# Patient Record
Sex: Female | Born: 1970 | Race: White | Hispanic: No | Marital: Married | Smoking: Current every day smoker
Health system: Southern US, Community
[De-identification: ages and names within clinical notes are randomized; demographics above are authoritative.]

## PROBLEM LIST (undated history)

## (undated) HISTORY — PX: NOSE SURGERY: SHX723

## (undated) HISTORY — PX: URETHRAL DILATION: SUR417

## (undated) HISTORY — PX: CLAVICLE SURGERY: SHX598

## (undated) HISTORY — PX: BARTHOLIN GLAND CYST EXCISION: SHX565

---

## 2006-06-19 ENCOUNTER — Emergency Department: Payer: Self-pay | Admitting: Emergency Medicine

## 2015-04-22 ENCOUNTER — Encounter: Payer: Self-pay | Admitting: Emergency Medicine

## 2015-04-22 ENCOUNTER — Emergency Department
Admission: EM | Admit: 2015-04-22 | Discharge: 2015-04-22 | Disposition: A | Discharge status: Home / Self Care | Payer: Self-pay

## 2015-04-22 ENCOUNTER — Emergency Department
Admission: EM | Admit: 2015-04-22 | Discharge: 2015-04-22 | Disposition: A | Discharge status: Home / Self Care | Payer: PRIVATE HEALTH INSURANCE | Attending: Emergency Medicine | Admitting: Emergency Medicine

## 2015-04-22 DIAGNOSIS — B958 Unspecified staphylococcus as the cause of diseases classified elsewhere: Secondary | ICD-10-CM | POA: Insufficient documentation

## 2015-04-22 DIAGNOSIS — L03312 Cellulitis of back [any part except buttock]: Secondary | ICD-10-CM | POA: Insufficient documentation

## 2015-04-22 DIAGNOSIS — Z720 Tobacco use: Secondary | ICD-10-CM | POA: Insufficient documentation

## 2015-04-22 DIAGNOSIS — L0390 Cellulitis, unspecified: Secondary | ICD-10-CM

## 2015-04-22 MED ORDER — SULFAMETHOXAZOLE-TRIMETHOPRIM 800-160 MG PO TABS: 2.0000 | ORAL_TABLET | Freq: Two times a day (BID) | ORAL | Status: DC

## 2015-04-22 NOTE — ED Notes (Signed)
Pt arrives to ER in custody. Pt alert and in NAD. Forensic blood draw completed per facility and police protocol. Police kit used per protocol. 

## 2015-04-22 NOTE — Discharge Instructions (Signed)
Abscess °An abscess is an infected area that contains a collection of pus and debris. It can occur in almost any part of the body. An abscess is also known as a furuncle or boil. °CAUSES  °An abscess occurs when tissue gets infected. This can occur from blockage of oil or sweat glands, infection of hair follicles, or a minor injury to the skin. As the body tries to fight the infection, pus collects in the area and creates pressure under the skin. This pressure causes pain. People with weakened immune systems have difficulty fighting infections and get certain abscesses more often.  °SYMPTOMS °Usually an abscess develops on the skin and becomes a painful mass that is red, warm, and tender. If the abscess forms under the skin, you may feel a moveable soft area under the skin. Some abscesses break open (rupture) on their own, but most will continue to get worse without care. The infection can spread deeper into the body and eventually into the bloodstream, causing you to feel ill.  °DIAGNOSIS  °Your caregiver will take your medical history and perform a physical exam. A sample of fluid may also be taken from the abscess to determine what is causing your infection. °TREATMENT  °Your caregiver may prescribe antibiotic medicines to fight the infection. However, taking antibiotics alone usually does not cure an abscess. Your caregiver may need to make a small cut (incision) in the abscess to drain the pus. In some cases, gauze is packed into the abscess to reduce pain and to continue draining the area. °HOME CARE INSTRUCTIONS  °· Only take over-the-counter or prescription medicines for pain, discomfort, or fever as directed by your caregiver. °· If you were prescribed antibiotics, take them as directed. Finish them even if you start to feel better. °· If gauze is used, follow your caregiver's directions for changing the gauze. °· To avoid spreading the infection: °· Keep your draining abscess covered with a  bandage. °· Wash your hands well. °· Do not share personal care items, towels, or whirlpools with others. °· Avoid skin contact with others. °· Keep your skin and clothes clean around the abscess. °· Keep all follow-up appointments as directed by your caregiver. °SEEK MEDICAL CARE IF:  °· You have increased pain, swelling, redness, fluid drainage, or bleeding. °· You have muscle aches, chills, or a general ill feeling. °· You have a fever. °MAKE SURE YOU:  °· Understand these instructions. °· Will watch your condition. °· Will get help right away if you are not doing well or get worse. °Document Released: 09/11/2005 Document Revised: 06/02/2012 Document Reviewed: 02/14/2012 °ExitCare® Patient Information ©2015 [employer]. This information is not intended to replace advice given to you by your health care provider. Make sure you discuss any questions you have with your health care provider. ° °Cellulitis °Cellulitis is an infection of the skin and the tissue under the skin. The infected area is usually red and tender. This happens most often in the arms and lower legs. °HOME CARE  °· Take your antibiotic medicine as told. Finish the medicine even if you start to feel better. °· Keep the infected arm or leg raised (elevated). °· Put a warm cloth on the area up to 4 times per day. °· Only take medicines as told by your doctor. °· Keep all doctor visits as told. °GET HELP IF: °· You see red streaks on the skin coming from the infected area. °· Your red area gets bigger or turns a dark color. °·   Your bone or joint under the infected area is painful after the skin heals.  Your infection comes back in the same area or different area.  You have a puffy (swollen) bump in the infected area.  You have new symptoms.  You have a fever. GET HELP RIGHT AWAY IF:   You feel very sleepy.  You throw up (vomit) or have watery poop (diarrhea).  You feel sick and have muscle aches and pains. MAKE SURE YOU:    Understand these instructions.  Will watch your condition.  Will get help right away if you are not doing well or get worse. Document Released: 05/20/2008 Document Revised: 04/18/2014 Document Reviewed: 02/17/2012 [facility] Patient Information 2015 [employer], [place]. This information is not intended to replace advice given to you by your health care provider. Make sure you discuss any questions you have with your health care provider.   Patient is medically-cleared for [place] [employer].  Apply warm compresses to promote healing.  Return to the ED as needed for wound check.  Take the prescription meds as directed.

## 2015-04-22 NOTE — ED Notes (Signed)
[place] [employer]. pt has several inch of redness and dark area noted to lumbar back, no drainge. pt reports she thinks it is a spider bite.

## 2015-04-23 MED ORDER — ORPHENADRINE CITRATE 30 MG/ML IJ SOLN
INTRAMUSCULAR | Status: AC
  Filled 2015-04-23: qty 2

## 2015-04-23 NOTE — ED Provider Notes (Signed)
[facility] Emergency Department Provider Note ?____________________________________________ ? Time seen: 2041 ? I have reviewed the triage vital signs and the nursing notes.  ________ HISTORY ? Chief Complaint Abscess  HPI  Carrie Leonard is a 44 y.o. female patient reports to the ED in the custody of police department primarily for medical clearance prior to incarceration. She has she has an area of redness, induration, warmth, tenderness to the lower back. She thinks she may have been bitten by a brown recluse spider several days earlier. She is also a known and or admitted IV drug user, with several areas on the bilateral antecubital areas, with induration and nodularity consistent with skin popping. She denies fever, chills, sweats or spontaneous drainage to this area. She is here for medical treatment and clearance.  History reviewed. No pertinent past medical history.  There are no active problems to display for this patient.  ? Past Surgical History  Procedure Laterality Date  . Urethral dilation     ?Allergies Review of patient's allergies indicates no known allergies. ? History reviewed. No pertinent family history. ? Social History History  Substance Use Topics  . Smoking status: Current Every Day Smoker  . Smokeless tobacco: Not on file  . Alcohol Use: Yes     Review of Systems Constitutional: Negative for fever. HEENT:  Normocephalic/atraumatic. Negative for visual/hearingchanges, sore throat, or nasal congestion. Cardiovascular: Negative for chest pain. Respiratory: Negative for shortness of breath. Musculoskeletal: Negative for back pain. Skin: cellulitis noted Neurological: Negative for headaches, focal weakness or numbness. Hematological/Lymphatic:Negative for enlarged lymph nodes  10-point ROS otherwise negative.  ____________________________________________   PHYSICAL EXAM:  VITAL SIGNS: ED Triage Vitals  Enc Vitals  Group     BP 04/22/15 2027 130/73 mmHg     Pulse Rate 04/22/15 2027 97     Resp 04/22/15 2027 22     Temp 04/22/15 2027 98.1 F (36.7 C)     Temp Source 04/22/15 2027 Oral     SpO2 04/22/15 2027 97 %     Weight 04/22/15 2027 135 lb (61.236 kg)     Height 04/22/15 2027 5\' 1"  (1.549 m)     Head Cir --      Peak Flow --      Pain Score 04/22/15 2027 7     Pain Loc --      Pain Edu? --      Excl. in [place]? --     Constitutional: Alert and oriented. Well appearing and in no distress. HEENT: Normocephalic and atraumatic.Conjunctivae are normal. PERRL. Normal extraocular movements. Mucous membranes are moist. Hematological/Lymphatic/Immunilogical: No cervical lymphadenopathy. Cardiovascular: Normal rate, regular rhythm.No murmurs, rubs, or gallops.  Respiratory: Normal respiratory effort. Breath sounds are clear and equal bilaterally. No wheezes/rales/rhonchi. Musculoskeletal: Nontender with normal range of motion in all extremities. No joint effusions.  No lower extremity tenderness nor edema. Neurologic:  Normal speech and language. No gross focal neurologic deficits are appreciated.  Skin:  Area to the lower back measuring about 6 cm in diameter with local erythema, induration, well-defined border, and central punctum. There is no active draining, fluctuance, or abscess appreciated. Psychiatric: Mood and affect are normal. Speech and behavior are normal. Patient exhibits appropriate insight and judgment.  _____________ PROCEDURES ? Procedure(s) performed: None Critical Care performed: None Current Outpatient Rx  Name  Route  Sig  Dispense  Refill  . sulfamethoxazole-trimethoprim (BACTRIM DS,SEPTRA DS) 800-160 MG per tablet   Oral   Take 2 tablets by mouth  2 (two) times daily.   40 tablet   0    _____________________________________________________ INITIAL IMPRESSION / ASSESSMENT AND PLAN / ED COURSE ? Cellulitis and chronic sterile abscess. Treatment and management instructions  to patient.  Medical care initiated and medical clearance given to [employer].   Pertinent labs & imaging results that were available during my care of the patient were reviewed by me and considered in my medical decision making (see chart for details).   ____________________________________________ FINAL CLINICAL IMPRESSION(S) / ED DIAGNOSES?  Final diagnoses:  Cellulitis due to Staphylococcus      Lissa HoardJenise V Bacon Maven Varelas, PA-C 04/23/15 0109  Governor Rooksebecca Lord, MD 04/26/15 1258

## 2016-02-04 DIAGNOSIS — L02416 Cutaneous abscess of left lower limb: Secondary | ICD-10-CM | POA: Insufficient documentation

## 2016-02-04 DIAGNOSIS — L02414 Cutaneous abscess of left upper limb: Secondary | ICD-10-CM | POA: Insufficient documentation

## 2016-02-04 DIAGNOSIS — B1920 Unspecified viral hepatitis C without hepatic coma: Secondary | ICD-10-CM | POA: Insufficient documentation

## 2016-02-04 DIAGNOSIS — F1910 Other psychoactive substance abuse, uncomplicated: Secondary | ICD-10-CM | POA: Insufficient documentation

## 2016-02-05 DIAGNOSIS — J4530 Mild persistent asthma, uncomplicated: Secondary | ICD-10-CM | POA: Insufficient documentation

## 2016-02-05 DIAGNOSIS — F17200 Nicotine dependence, unspecified, uncomplicated: Secondary | ICD-10-CM | POA: Insufficient documentation

## 2018-07-22 DIAGNOSIS — S42022A Displaced fracture of shaft of left clavicle, initial encounter for closed fracture: Secondary | ICD-10-CM | POA: Insufficient documentation

## 2020-01-04 ENCOUNTER — Encounter: Payer: Self-pay | Admitting: Emergency Medicine

## 2020-01-04 ENCOUNTER — Emergency Department
Admission: EM | Admit: 2020-01-04 | Discharge: 2020-01-05 | Disposition: A | Discharge status: Home / Self Care | Payer: Self-pay | Attending: Emergency Medicine | Admitting: Emergency Medicine

## 2020-01-04 ENCOUNTER — Other Ambulatory Visit: Payer: Self-pay

## 2020-01-04 DIAGNOSIS — K222 Esophageal obstruction: Secondary | ICD-10-CM | POA: Insufficient documentation

## 2020-01-04 DIAGNOSIS — X58XXXA Exposure to other specified factors, initial encounter: Secondary | ICD-10-CM | POA: Insufficient documentation

## 2020-01-04 DIAGNOSIS — Z20822 Contact with and (suspected) exposure to covid-19: Secondary | ICD-10-CM | POA: Insufficient documentation

## 2020-01-04 DIAGNOSIS — Z538 Procedure and treatment not carried out for other reasons: Secondary | ICD-10-CM | POA: Insufficient documentation

## 2020-01-04 DIAGNOSIS — F17210 Nicotine dependence, cigarettes, uncomplicated: Secondary | ICD-10-CM | POA: Insufficient documentation

## 2020-01-04 DIAGNOSIS — T18128A Food in esophagus causing other injury, initial encounter: Secondary | ICD-10-CM | POA: Insufficient documentation

## 2020-01-04 MED ORDER — GLUCAGON HCL RDNA (DIAGNOSTIC) 1 MG IJ SOLR
1.0000 mg | Freq: Once | INTRAMUSCULAR | Status: AC
  Administered 2020-01-05: 1 mg via INTRAVENOUS
  Filled 2020-01-04: qty 1

## 2020-01-04 MED ORDER — ONDANSETRON HCL 4 MG/2ML IJ SOLN
4.0000 mg | Freq: Once | INTRAMUSCULAR | Status: AC
  Administered 2020-01-05: 4 mg via INTRAVENOUS
  Filled 2020-01-04: qty 2

## 2020-01-04 NOTE — ED Triage Notes (Addendum)
Pt arrived via POV with reports of food bolus since about 12pm today.  Pt states she has a hx of food stuck before and can usually tell when it passes, pt states she tried to eat peanut butter to help it move, but states she is unable to keep any liquids down and vomits everytime she drinks something.   Pt also states that she noticed blood when she vomited.

## 2020-01-05 ENCOUNTER — Ambulatory Visit: Admit: 2020-01-05 | Payer: Self-pay | Admitting: Gastroenterology

## 2020-01-05 ENCOUNTER — Encounter: Payer: Self-pay | Admitting: Anesthesiology

## 2020-01-05 ENCOUNTER — Encounter
Admission: EM | Disposition: A | Discharge status: Home / Self Care | Payer: Self-pay | Source: Home / Self Care | Attending: Emergency Medicine

## 2020-01-05 ENCOUNTER — Emergency Department: Payer: Self-pay

## 2020-01-05 DIAGNOSIS — K222 Esophageal obstruction: Secondary | ICD-10-CM

## 2020-01-05 DIAGNOSIS — T18128A Food in esophagus causing other injury, initial encounter: Secondary | ICD-10-CM

## 2020-01-05 LAB — URINE DRUG SCREEN, QUALITATIVE (ARMC ONLY)
Amphetamines, Ur Screen: NOT DETECTED
Barbiturates, Ur Screen: NOT DETECTED
Benzodiazepine, Ur Scrn: POSITIVE — AB
Cannabinoid 50 Ng, Ur ~~LOC~~: NOT DETECTED
Cocaine Metabolite,Ur ~~LOC~~: POSITIVE — AB
MDMA (Ecstasy)Ur Screen: NOT DETECTED
Methadone Scn, Ur: NOT DETECTED
Opiate, Ur Screen: NOT DETECTED
Phencyclidine (PCP) Ur S: NOT DETECTED
Tricyclic, Ur Screen: NOT DETECTED

## 2020-01-05 LAB — COMPREHENSIVE METABOLIC PANEL
ALT: 53 U/L — ABNORMAL HIGH (ref 0–44)
AST: 47 U/L — ABNORMAL HIGH (ref 15–41)
Albumin: 4.8 g/dL (ref 3.5–5.0)
Alkaline Phosphatase: 90 U/L (ref 38–126)
Anion gap: 9 (ref 5–15)
BUN: 26 mg/dL — ABNORMAL HIGH (ref 6–20)
CO2: 28 mmol/L (ref 22–32)
Calcium: 9.8 mg/dL (ref 8.9–10.3)
Chloride: 104 mmol/L (ref 98–111)
Creatinine, Ser: 0.91 mg/dL (ref 0.44–1.00)
GFR calc Af Amer: >60 mL/min (ref >60)
GFR calc non Af Amer: >60 mL/min (ref >60)
Glucose, Bld: 104 mg/dL — ABNORMAL HIGH (ref 70–99)
Potassium: 3.7 mmol/L (ref 3.5–5.1)
Sodium: 141 mmol/L (ref 135–145)
Total Bilirubin: 1.1 mg/dL (ref 0.3–1.2)
Total Protein: 8.7 g/dL — ABNORMAL HIGH (ref 6.5–8.1)

## 2020-01-05 LAB — RESPIRATORY PANEL BY RT PCR (FLU A&B, COVID)
Influenza A by PCR: NEGATIVE
Influenza B by PCR: NEGATIVE
SARS Coronavirus 2 by RT PCR: NEGATIVE

## 2020-01-05 LAB — CBC
HCT: 44 % (ref 36.0–46.0)
Hemoglobin: 14.9 g/dL (ref 12.0–15.0)
MCH: 30.4 pg (ref 26.0–34.0)
MCHC: 33.9 g/dL (ref 30.0–36.0)
MCV: 89.8 fL (ref 80.0–100.0)
Platelets: 227 10*3/uL (ref 150–400)
RBC: 4.9 MIL/uL (ref 3.87–5.11)
RDW: 12.6 % (ref 11.5–15.5)
WBC: 13.6 10*3/uL — ABNORMAL HIGH (ref 4.0–10.5)
nRBC: 0 % (ref 0.0–0.2)

## 2020-01-05 SURGERY — ESOPHAGOGASTRODUODENOSCOPY (EGD) WITH PROPOFOL
Anesthesia: General

## 2020-01-05 MED ORDER — LORAZEPAM 2 MG/ML IJ SOLN
1.0000 mg | Freq: Once | INTRAMUSCULAR | Status: AC
Start: 2020-01-05 — End: 2020-01-05
  Administered 2020-01-05: 1 mg via INTRAVENOUS
  Filled 2020-01-05: qty 1

## 2020-01-05 MED ORDER — PROPOFOL 500 MG/50ML IV EMUL
INTRAVENOUS | Status: AC
  Filled 2020-01-05: qty 50

## 2020-01-05 MED ORDER — LORAZEPAM 2 MG/ML IJ SOLN
INTRAMUSCULAR | Status: AC
  Filled 2020-01-05: qty 1

## 2020-01-05 NOTE — ED Notes (Addendum)
Patient provided water to drink to assess if food bolus has moved. Patient was unable to keep fluid down and began to vomit the fluid again.

## 2020-01-05 NOTE — ED Notes (Signed)
Patient is drowsy after medication

## 2020-01-05 NOTE — ED Notes (Signed)
Pt oob with standby assist to BR without SOB.

## 2020-01-05 NOTE — ED Notes (Signed)
Pt denies SOB. Breath sounds clear. Spitting up clear secretions. NAD at this time.

## 2020-01-05 NOTE — ED Provider Notes (Signed)
[facility] Emergency Department Provider Note  ____________________________________________   First MD Initiated Contact with Patient 01/04/20 2341     (approximate)  I have reviewed the triage vital signs and the nursing notes.   HISTORY  Chief Complaint Food Bolus   HPI Carrie Leonard is a 49 y.o. female with history of IV cocaine use presents to the emergency department secondary to "roast beef stuck in my throat since noon today".  Patient states that she was eating a roast beef today when she felt like it became stuck in her throat patient then states that she ate some peanut butter in an attempt to get the roast beef to go down.  Patient states that this is happened multiple times in the past and that she is usually able to have it go down when she eats peanut butter       History reviewed. No pertinent past medical history.  There are no problems to display for this patient.   Past Surgical History:  Procedure Laterality Date  . URETHRAL DILATION      Prior to Admission medications   Medication Sig Start Date End Date Taking? Authorizing Provider  sulfamethoxazole-trimethoprim (BACTRIM DS,SEPTRA DS) 800-160 MG per tablet Take 2 tablets by mouth 2 (two) times daily. 04/22/15   Menshew, Dannielle Karvonen, PA-C    Allergies Patient has no known allergies.  No family history on file.  Social History Social History   Tobacco Use  . Smoking status: Current Every Day Smoker    Packs/day: 1.00    Types: Cigarettes  . Smokeless tobacco: Never Used  Substance Use Topics  . Alcohol use: Yes  . Drug use: Not on file    Review of Systems Constitutional: No fever/chills Eyes: No visual changes. ENT: No sore throat. Cardiovascular: Denies chest pain. Respiratory: Denies shortness of breath. Gastrointestinal: No abdominal pain.  No nausea, no vomiting.  No diarrhea.  No constipation. Genitourinary: Negative for dysuria. Musculoskeletal:  Negative for neck pain.  Negative for back pain. Integumentary: Negative for rash. Neurological: Negative for headaches, focal weakness or numbness.  ____________________________________________   PHYSICAL EXAM:  VITAL SIGNS: ED Triage Vitals  Enc Vitals Group     BP 01/04/20 2316 127/69     Pulse Rate 01/04/20 2316 94     Resp 01/04/20 2316 20     Temp 01/04/20 2316 98.5 F (36.9 C)     Temp Source 01/04/20 2316 Oral     SpO2 01/04/20 2316 95 %     Weight 01/04/20 2313 57.2 kg (126 lb)     Height 01/04/20 2313 1.549 m (5\' 1" )     Head Circumference --      Peak Flow --      Pain Score 01/04/20 2313 7     Pain Loc --      Pain Edu? --      Excl. in [place]? --     Constitutional: Alert and oriented.  Actively vomiting oral secretions Eyes: Conjunctivae are normal.  Mouth/Throat: Patient is wearing a mask. Neck: No stridor.  No meningeal signs.   Cardiovascular: Normal rate, regular rhythm. Good peripheral circulation. Grossly normal heart sounds. Respiratory: Normal respiratory effort.  No retractions. Gastrointestinal: Soft and nontender. No distention.  Musculoskeletal: No lower extremity tenderness nor edema. No gross deformities of extremities. Neurologic:  Normal speech and language. No gross focal neurologic deficits are appreciated.  Skin:  Skin is warm, dry and intact. Psychiatric: Mood and affect  are normal. Speech and behavior are normal.  ____________________________________________   LABS (all labs ordered are listed, but only abnormal results are displayed)  Labs Reviewed  CBC - Abnormal; Notable for the following components:      Result Value   WBC 13.6 (*)    All other components within normal limits  COMPREHENSIVE METABOLIC PANEL - Abnormal; Notable for the following components:   Glucose, Bld 104 (*)    BUN 26 (*)    Total Protein 8.7 (*)    AST 47 (*)    ALT 53 (*)    All other components within normal limits   _______________  RADIOLOGY I,  [place] Maretta Overdorf, personally viewed and evaluated these images (plain radiographs) as part of my medical decision making, as well as reviewing the written report by the radiologist.  ED MD interpretation: No active cardiopulmonary disease noted on chest x-ray.  Official radiology report(s): DG Chest 2 View  Result Date: 01/05/2020 CLINICAL DATA:  Dysphagia EXAM: CHEST - 2 VIEW COMPARISON:  None. FINDINGS: The heart size is normal. There is no evidence for pneumomediastinum. The patient is status post prior plate screw fixation of the left clavicle. There is no pneumothorax. No focal infiltrate. No significant pleural effusion. IMPRESSION: No active cardiopulmonary disease. Electronically Signed   By: Katherine Mantle M.D.   On: 01/05/2020 01:31    ____________________________________________   Procedures   ____________________________________________   INITIAL IMPRESSION / MDM / ASSESSMENT AND PLAN / ED COURSE  As part of my medical decision making, I reviewed the following data within the electronic MEDICAL RECORD NUMBER   49 year old female presented with above-stated history and physical exam secondary to esophageal food impaction.  Attempted carbonated beverage however patient subsequently had emesis.  Patient given IV glucagon 1 mg with out any improvement of symptoms..  Patient still unable to tolerate p.o. liquids and as such patient discussed with Dr. Tobi Bastos gastroenterologist on-call who plans to perform endoscopy later this morning.  Patient is currently resting comfortably and handling oral secretions.  ____________________________________________  FINAL CLINICAL IMPRESSION(S) / ED DIAGNOSES  Final diagnoses:  Esophageal obstruction due to food impaction     MEDICATIONS GIVEN DURING THIS VISIT:  Medications  glucagon (human recombinant) (GLUCAGEN) injection 1 mg (1 mg Intravenous Given 01/05/20 0026)  ondansetron (ZOFRAN) injection 4 mg (4 mg Intravenous Given 01/05/20 0027)      ED Discharge Orders    None      *Please note:  Carrie Leonard was evaluated in Emergency Department on 01/05/2020 for the symptoms described in the history of present illness. She was evaluated in the context of the global COVID-19 pandemic, which necessitated consideration that the patient might be at risk for infection with the SARS-CoV-2 virus that causes COVID-19. Institutional protocols and algorithms that pertain to the evaluation of patients at risk for COVID-19 are in a state of rapid change based on information released by regulatory bodies including the CDC and federal and state organizations. These policies and algorithms were followed during the patient's care in the ED.  Some ED evaluations and interventions may be delayed as a result of limited staffing during the pandemic.*  Note:  This document was prepared using Dragon voice recognition software and may include unintentional dictation errors.   Darci Current, MD 01/05/20 [phone]

## 2020-01-05 NOTE — ED Notes (Signed)
Pt provided with sip of soda. Pt able to tolerate first sip but unable to tolerate second. Pt producing foam emesis in bag. MD made aware.

## 2020-01-05 NOTE — Consult Note (Signed)
Carrie Leonard , MD [address], [place], [place], [place], [address] [address] [place], [place], [place], [address] Phone: [phone]  Fax: [phone]  Consultation  Referring Provider:     Dr Owens Shark  Primary Care Physician:  Carrie Leonard Carrie Jarvis, MD Primary Gastroenterologist: None         Reason for Consultation:     Food impaction  Date of Admission:  01/04/2020 Date of Consultation:  01/05/2020         HPI:   Carrie Leonard is a 49 y.o. female presented to the emergency room early this morning with a food impaction.  Was eating roast beef at around noon and got stuck in her esophagus.  Subsequently she tried consuming some peanut butter to push the food down.  Did not go through and came into the ER.  In the ER was given soda and glucagon which did not help.  History of cocaine use.  I was informed around 4 AM and the ER that we will perform an EGD this morning.  When came in to th endoscopy unit for an EGD , she felt the impaction had cleared. Drank 2 cups of water and it went down .    History reviewed. No pertinent past medical history.  Past Surgical History:  Procedure Laterality Date  . URETHRAL DILATION      Prior to Admission medications   Medication Sig Start Date End Date Taking? Authorizing Provider  sulfamethoxazole-trimethoprim (BACTRIM DS,SEPTRA DS) 800-160 MG per tablet Take 2 tablets by mouth 2 (two) times daily. 04/22/15   Menshew, Dannielle Karvonen, PA-C    No family history on file.   Social History   Tobacco Use  . Smoking status: Current Every Day Smoker    Packs/day: 1.00    Types: Cigarettes  . Smokeless tobacco: Never Used  Substance Use Topics  . Alcohol use: Yes  . Drug use: Not on file    Allergies as of 01/04/2020  . (No Known Allergies)    Review of Systems:    All systems reviewed and negative except where noted in HPI.   Physical Exam:  Vital signs in last 24 hours: Temp:  [98.5 F (36.9 C)] 98.5 F (36.9 C) (01/19  2316) Pulse Rate:  [92-110] 110 (01/20 0700) Resp:  [20] 20 (01/20 0249) BP: (126-150)/(69-96) 150/77 (01/20 0700) SpO2:  [94 %-96 %] 94 % (01/20 0700) Weight:  [57.2 kg] 57.2 kg (01/19 2313)   General:   Pleasant, cooperative in NAD Head:  Normocephalic and atraumatic. Eyes:   No icterus.   Conjunctiva pink. PERRLA. Ears:  Normal auditory acuity. Psych:  Alert and cooperative. Normal affect.  LAB RESULTS: Recent Labs    01/05/20 0032  WBC 13.6*  HGB 14.9  HCT 44.0  PLT 227   BMET Recent Labs    01/05/20 0032  NA 141  K 3.7  CL 104  CO2 28  GLUCOSE 104*  BUN 26*  CREATININE 0.91  CALCIUM 9.8   LFT Recent Labs    01/05/20 0032  PROT 8.7*  ALBUMIN 4.8  AST 47*  ALT 53*  ALKPHOS 90  BILITOT 1.1   PT/INR No results for input(s): LABPROT, INR in the last 72 hours.  STUDIES: DG Chest 2 View  Result Date: 01/05/2020 CLINICAL DATA:  Dysphagia EXAM: CHEST - 2 VIEW COMPARISON:  None. FINDINGS: The heart size is normal. There is no evidence for pneumomediastinum. The patient is status post prior plate screw  fixation of the left clavicle. There is no pneumothorax. No focal infiltrate. No significant pleural effusion. IMPRESSION: No active cardiopulmonary disease. Electronically Signed   By: Katherine Mantle M.D.   On: 01/05/2020 01:31      Impression / Plan:   Carrie Leonard is a 49 y.o. y/o female with a food impaction at noon yesterday.  History of cocaine use. The obstruction relieved on its own when she came up to endoscopy   Plan 1.  Home and outpatient GI follow up   Thank you for involving me in the care of this patient.      LOS: 0 days   Wyline Mood, MD  01/05/2020, 9:20 AM

## 2020-01-05 NOTE — Progress Notes (Signed)
Procedure was cancelled. Food bolus was cleared after drinking water. Dr. Tobi Bastos was present at the bedside. Patient to be discharged home, her mother will be transporting her home.

## 2020-01-05 NOTE — ED Notes (Signed)
Patient unable to hold fluid down. Patient was provided cola after receiving glucagon and states she vomited the fluid back up again

## 2020-01-05 NOTE — ED Notes (Signed)
Woke pt up. Able to talk, no sob noted. Offered to talk to edp to see if pt could have a fluid challenge. Pt stated that it was still there and wanted to have procedure.

## 2020-01-05 NOTE — ED Notes (Signed)
Patient states at 12pm she ate roast and it has felt stuck in her throat since. Patient states she is unable to swallow fluids. Patient states she vomits anything swallowed. MD provided patient with soda and patient was unable to tolerate and vomited after.

## 2022-01-02 ENCOUNTER — Other Ambulatory Visit: Payer: Self-pay

## 2022-01-02 DIAGNOSIS — Z1231 Encounter for screening mammogram for malignant neoplasm of breast: Secondary | ICD-10-CM

## 2022-02-04 ENCOUNTER — Other Ambulatory Visit: Payer: Self-pay

## 2022-02-04 DIAGNOSIS — Z1211 Encounter for screening for malignant neoplasm of colon: Secondary | ICD-10-CM

## 2022-02-06 ENCOUNTER — Ambulatory Visit: Payer: Self-pay | Attending: Oncology

## 2022-04-22 ENCOUNTER — Other Ambulatory Visit: Payer: Self-pay

## 2022-04-22 DIAGNOSIS — Z1211 Encounter for screening for malignant neoplasm of colon: Secondary | ICD-10-CM

## 2022-04-23 ENCOUNTER — Ambulatory Visit: Payer: Self-pay | Attending: Hematology and Oncology | Admitting: *Deleted

## 2022-04-23 ENCOUNTER — Other Ambulatory Visit: Payer: Self-pay

## 2022-04-23 ENCOUNTER — Encounter: Payer: Self-pay | Admitting: *Deleted

## 2022-04-23 ENCOUNTER — Ambulatory Visit
Admission: RE | Admit: 2022-04-23 | Discharge: 2022-04-23 | Disposition: A | Discharge status: Home / Self Care | Payer: Self-pay | Source: Ambulatory Visit | Attending: Obstetrics and Gynecology | Admitting: Obstetrics and Gynecology

## 2022-04-23 ENCOUNTER — Encounter (INDEPENDENT_AMBULATORY_CARE_PROVIDER_SITE_OTHER): Payer: Self-pay

## 2022-04-23 VITALS — BP 149/69 | HR 71 | Resp 18 | Wt 128.0 lb

## 2022-04-23 DIAGNOSIS — Z01419 Encounter for gynecological examination (general) (routine) without abnormal findings: Secondary | ICD-10-CM

## 2022-04-23 DIAGNOSIS — Z1231 Encounter for screening mammogram for malignant neoplasm of breast: Secondary | ICD-10-CM | POA: Insufficient documentation

## 2022-04-23 NOTE — Progress Notes (Addendum)
Carrie Leonard is a 51 y.o. female who presents to [facility] clinic today with no complaints She is here for her annual screening well woman visit.  ?  ?Pap Smear: Pap not smear completed today. Last Pap smear was 01/11/20 at [facility] clinic and was normal. Per patient has no history of an abnormal Pap smear. Last Pap smear result is available in Epic. Last pap was negative without HPV co-testing. ?  ?Physical exam: ?Breasts ?Breasts symmetrical. Bilateral breasts have significant bruising, especially on the left. Old and new bruises noted with yellow and purple and red hues.  Asked patient if she had fallen and she stated that "I've been shooting up".  No nipple retraction bilateral breasts. No nipple discharge bilateral breasts. No lymphadenopathy. No lumps palpated bilateral breasts. I did note track mark on bilateral hands and arms. ?  ?Pelvic/Bimanual ?Pap is not indicated today  ?  ?Smoking History: ?Patient has is a current smoker at 1 packs per day.  Patient referred to quit line and [place] Smoking Cessation classes.  ?  ?Patient Navigation: ?Patient education provided. Access to services provided for patient through [facility] program. No interpreter provided. No transportation provided  ? ?Colorectal Cancer Screening: ?Per patient has never had colonoscopy completed.  No complaints today. Fit test given to patient.  Instructed to return via mail. ?  ?Breast and Cervical Cancer Risk Assessment: ?Patient does not have family history of breast cancer, known genetic mutations, or radiation treatment to the chest before age 29. Patient has history of cervical dysplasia, immunocompromised, or DES exposure in-utero.  Patient's sister deceased in January 02, 2023from Uterine cancer per patient. ? ?Risk Assessment   ?No risk assessment data ?  ? ?Risk Assessment   ? ? Risk Scores   ? ?   04/23/2022  ? Last edited by: Lesle Chris, RN  ? 5-year risk: 1.1 %  ? Lifetime risk: 8.8 %  ? ?  ?  ? ?  ?   ? ?A: ?BCCCP exam without pap smear ? ?P: ?Referred patient to the [facility] for a screening mammogram. Appointment scheduled for today. ? ?Jim Like, RN ?04/23/2022 1:47 PM   ?

## 2023-04-08 ENCOUNTER — Other Ambulatory Visit: Payer: Self-pay

## 2023-04-08 DIAGNOSIS — Z1231 Encounter for screening mammogram for malignant neoplasm of breast: Secondary | ICD-10-CM

## 2023-04-28 ENCOUNTER — Ambulatory Visit
Admission: RE | Admit: 2023-04-28 | Discharge: 2023-04-28 | Disposition: A | Discharge status: Home / Self Care | Payer: Self-pay | Source: Ambulatory Visit | Attending: Obstetrics and Gynecology | Admitting: Obstetrics and Gynecology

## 2023-04-28 ENCOUNTER — Ambulatory Visit: Payer: Self-pay | Attending: Hematology and Oncology | Admitting: Hematology and Oncology

## 2023-04-28 ENCOUNTER — Ambulatory Visit: Payer: Self-pay

## 2023-04-28 VITALS — BP 130/68 | Wt 130.0 lb

## 2023-04-28 DIAGNOSIS — Z1231 Encounter for screening mammogram for malignant neoplasm of breast: Secondary | ICD-10-CM | POA: Insufficient documentation

## 2023-04-28 DIAGNOSIS — Z1211 Encounter for screening for malignant neoplasm of colon: Secondary | ICD-10-CM

## 2023-04-28 DIAGNOSIS — Z01419 Encounter for gynecological examination (general) (routine) without abnormal findings: Secondary | ICD-10-CM

## 2023-04-28 MED ORDER — CEPHALEXIN 500 MG PO CAPS: 500.0000 mg | ORAL_CAPSULE | Freq: Four times a day (QID) | ORAL | 0 refills | Status: AC

## 2023-04-28 NOTE — Progress Notes (Signed)
Ms. Carrie Leonard is a 52 y.o. No obstetric history on file. female who presents to [facility] clinic today with no complaints.    Pap Smear: Pap smear completed today. Last Pap smear was 01/11/2020 at [facility] clinic and was normal with HPV not completed.  Per patient has history of an abnormal Pap smear with cryotherapy in her teens. Last Pap smear result is available in Epic.   Physical exam: Breasts Breasts symmetrical. No skin abnormalities bilateral breasts. No nipple retraction bilateral breasts. No nipple discharge bilateral breasts. No lymphadenopathy. No lumps palpated bilateral breasts. MS DIGITAL SCREENING TOMO BILATERAL  Result Date: 04/24/2022 CLINICAL DATA:  Screening. EXAM: DIGITAL SCREENING BILATERAL MAMMOGRAM WITH TOMOSYNTHESIS AND CAD TECHNIQUE: Bilateral screening digital craniocaudal and mediolateral oblique mammograms were obtained. Bilateral screening digital breast tomosynthesis was performed. The images were evaluated with computer-aided detection. COMPARISON:  This is the patient's baseline mammogram. ACR Breast Density Category b: There are scattered areas of fibroglandular density. FINDINGS: There are no findings suspicious for malignancy. IMPRESSION: No mammographic evidence of malignancy. A result letter of this screening mammogram will be mailed directly to the patient. RECOMMENDATION: Screening mammogram in one year. (Code:[id number]-01Y) BI-RADS CATEGORY  1: Negative. Electronically Signed   By: Romona Curls M.D.   On: 04/24/2022 09:30          Pelvic/Bimanual Ext Genitalia No lesions, no swelling and no discharge observed on external genitalia.        Vagina Vagina pink and normal texture. No lesions or discharge observed in vagina.        Cervix Cervix is present. Cervix pink and of normal texture. No discharge observed.    Uterus Uterus is present and palpable. Uterus in normal position and normal size.        Adnexae Bilateral ovaries present  and palpable. No tenderness on palpation.         Rectovaginal No rectal exam completed today since patient had no rectal complaints. No skin abnormalities observed on exam.     Smoking History: Patient has is a current smoker at 1 packs per day and was referred to quit line. We will refer for lung cancer screening.    Patient Navigation: Patient education provided. Access to services provided for patient through [facility] program. No interpreter provided. No transportation provided   Colorectal Cancer Screening: Per patient has never had colonoscopy completed No complaints today. FIT test given.    Breast and Cervical Cancer Risk Assessment: Patient does not have family history of breast cancer, known genetic mutations, or radiation treatment to the chest before age 58. Patient has history of cervical dysplasia, immunocompromised, or DES exposure in-utero.  Risk Scores as of 04/28/2023     Carrie Leonard           5-year 0.72 %   Lifetime 5.67 %            Last calculated by Caprice Red, CMA on 04/28/2023 at  1:14 PM        A: [facility] exam with pap smear No complaints with benign exam. Several sores noted to bilateral hands that are red, warm to touch and swollen. 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 We discussed referral for drug use and she declines at this time.   P: [facility]. Appointment scheduled 04/28/2023.  Pascal Lux, NP 04/28/2023 1:47 PM

## 2023-04-28 NOTE — Patient Instructions (Signed)
Taught Carrie Leonard about self breast awareness and gave educational materials to take home. Patient did not need a Pap smear today due to last Pap smear was in 01/10/2023 per patient.  Let her know [facility] will cover Pap smears every 5 years unless has a history of abnormal Pap smears. [facility]. Appointment scheduled for 04/28/23. Patient aware of appointment and will be there. Let patient know will follow up with her within the next couple weeks with results. Carrie Leonard verbalized understanding.  Pascal Lux, NP 1:52 PM

## 2023-04-30 ENCOUNTER — Telehealth: Payer: Self-pay

## 2023-04-30 ENCOUNTER — Other Ambulatory Visit: Payer: Self-pay

## 2023-04-30 DIAGNOSIS — N760 Acute vaginitis: Secondary | ICD-10-CM

## 2023-04-30 LAB — CYTOLOGY - PAP
Adequacy: ABSENT
Comment: NEGATIVE
Diagnosis: NEGATIVE
High risk HPV: NEGATIVE

## 2023-04-30 MED ORDER — METRONIDAZOLE 500 MG PO TABS: 500.0000 mg | ORAL_TABLET | Freq: Two times a day (BID) | ORAL | 0 refills | Status: AC

## 2023-04-30 NOTE — Telephone Encounter (Signed)
I spoke with pt and advised of her PAP results. Pt also has BV and a verbal okay received from Henrietta Dine, NP, to send Flagyl. Pt expressed understanding of her results and recommended follow-up. I have confirmed her [facility] and rx has been sent.

## 2023-05-16 LAB — FECAL OCCULT BLOOD, IMMUNOCHEMICAL: Fecal Occult Bld: NEGATIVE

## 2024-02-16 DIAGNOSIS — N200 Calculus of kidney: Secondary | ICD-10-CM | POA: Diagnosis not present

## 2024-02-16 DIAGNOSIS — K8020 Calculus of gallbladder without cholecystitis without obstruction: Secondary | ICD-10-CM | POA: Diagnosis not present

## 2024-02-16 DIAGNOSIS — K769 Liver disease, unspecified: Secondary | ICD-10-CM | POA: Diagnosis not present

## 2024-03-19 DIAGNOSIS — Z122 Encounter for screening for malignant neoplasm of respiratory organs: Secondary | ICD-10-CM | POA: Diagnosis not present

## 2024-03-19 DIAGNOSIS — F17210 Nicotine dependence, cigarettes, uncomplicated: Secondary | ICD-10-CM | POA: Diagnosis not present

## 2024-03-19 DIAGNOSIS — Z87891 Personal history of nicotine dependence: Secondary | ICD-10-CM | POA: Diagnosis not present

## 2024-04-16 IMAGING — MG MM DIGITAL SCREENING BILAT W/ TOMO AND CAD
8 series · 9 of 24 positions shown · non-contrast
Comparison: This is the patient's baseline mammogram.

CLINICAL DATA: Screening.

EXAM:
DIGITAL SCREENING BILATERAL MAMMOGRAM WITH TOMOSYNTHESIS AND CAD
TECHNIQUE: Bilateral screening digital craniocaudal and mediolateral oblique
mammograms were obtained. Bilateral screening digital breast
tomosynthesis was performed. The images were evaluated with
computer-aided detection.

[R MLO synth-2D]
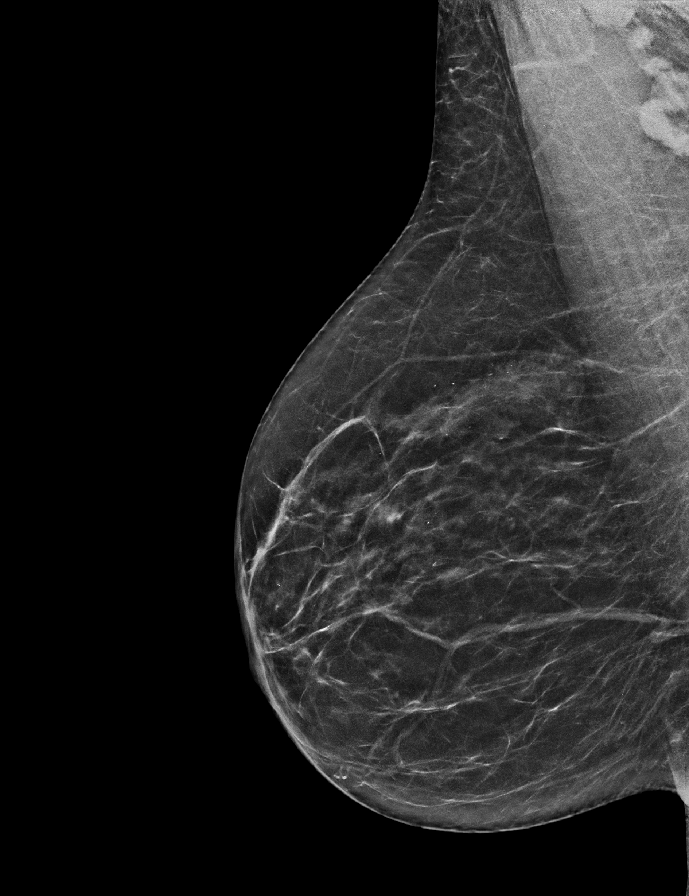

[L MLO synth-2D]
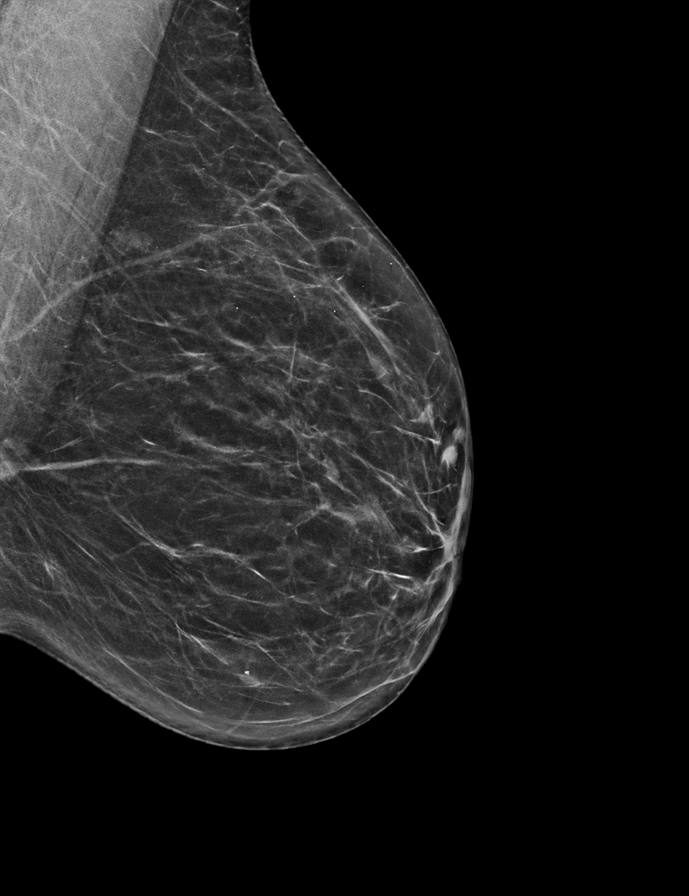

[L CC synth-2D]
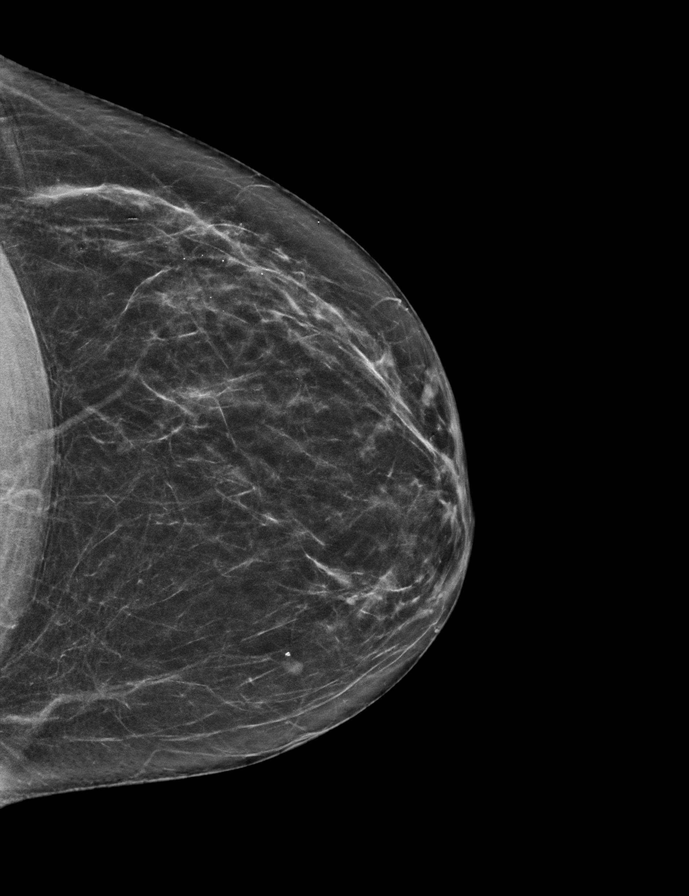

[R CC synth-2D]
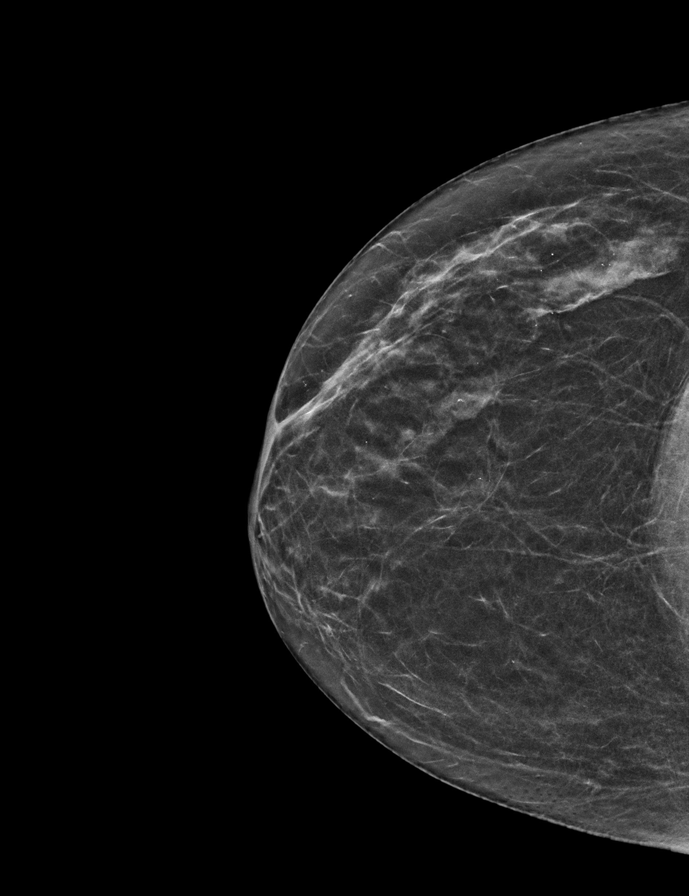

[L MLO tomo · 2 of 57 frames shown]
[frame 19/57]
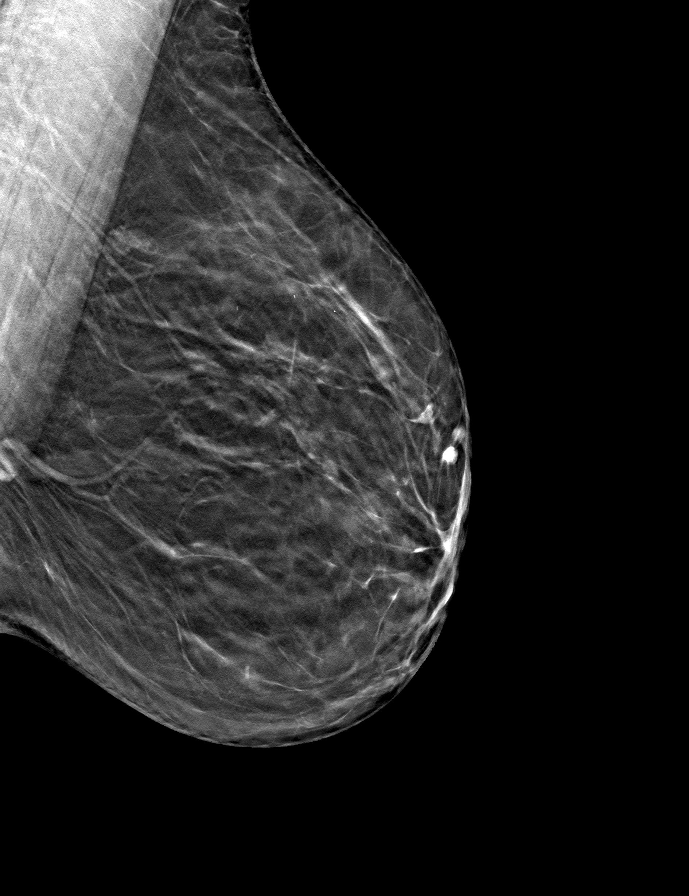
[frame 29/57]
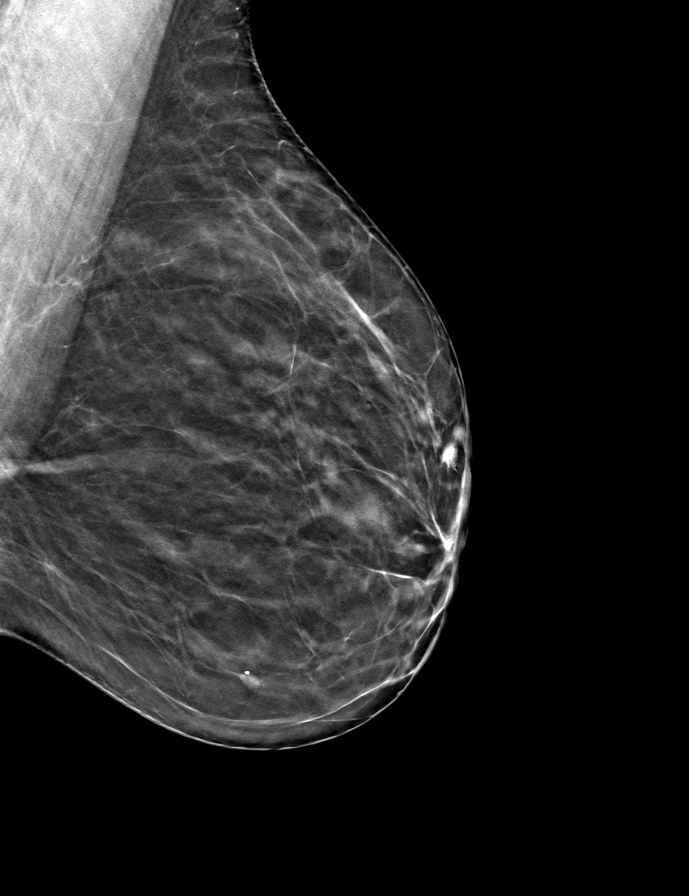

[L CC tomo · tomo slice 29/57.0]
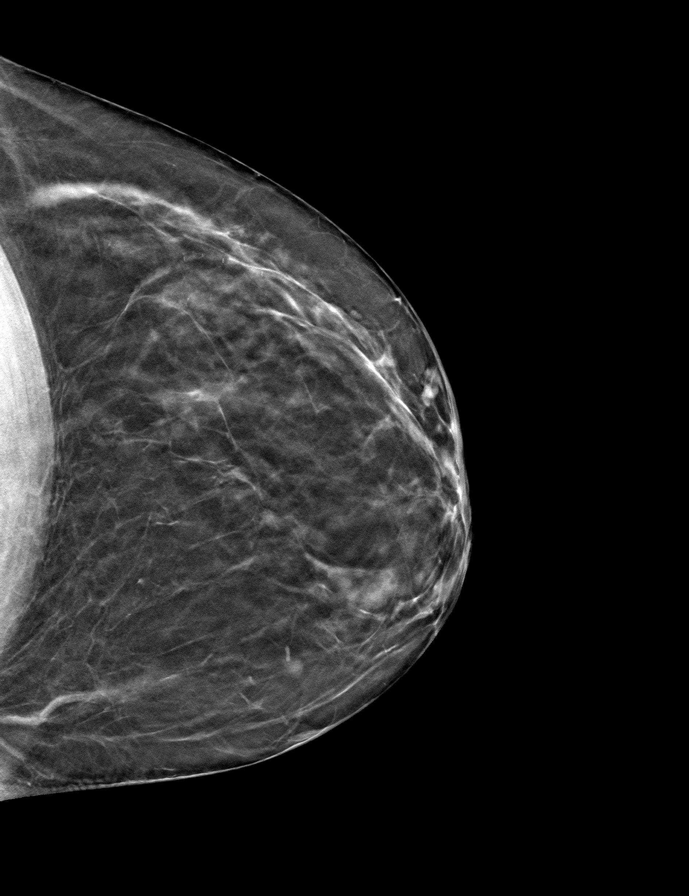

[R MLO tomo · tomo slice 28/55.0]
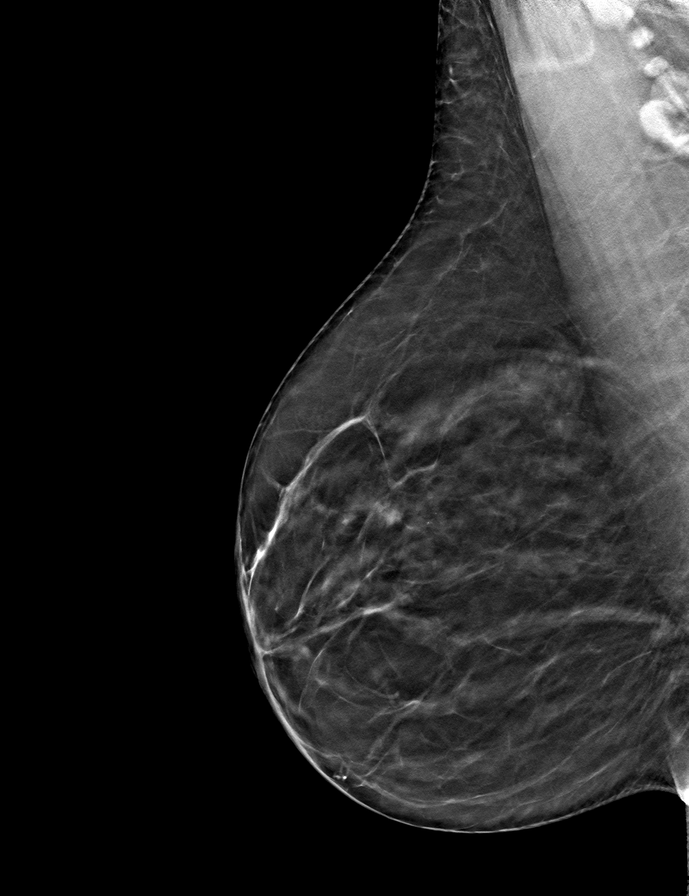

[R CC tomo · tomo slice 25/49.0]
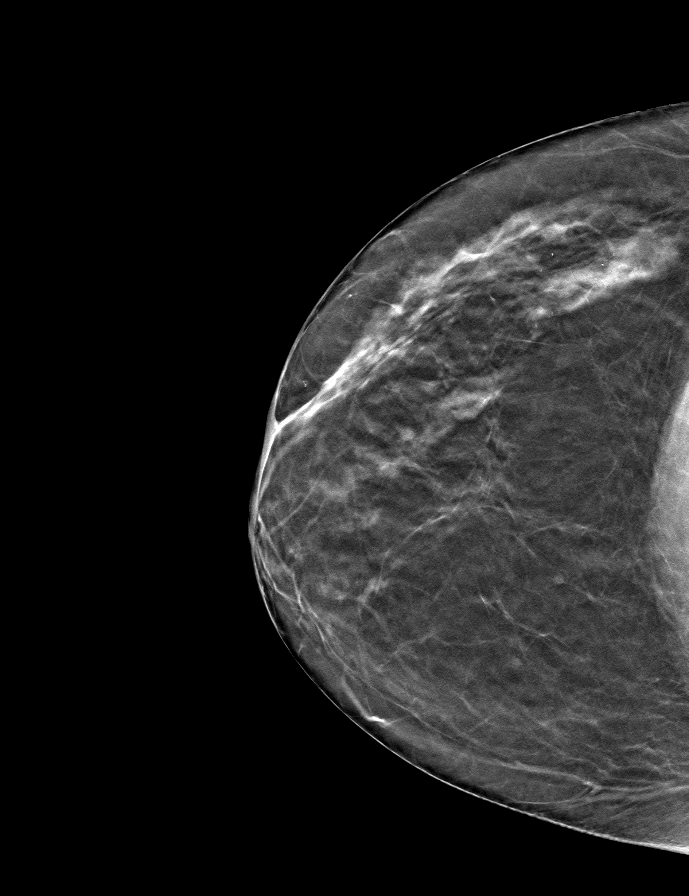

[9 of 24 positions shown; findings below may reference images not displayed]

ACR Breast Density Category b: There are scattered areas of
fibroglandular density.
FINDINGS: There are no findings suspicious for malignancy.
IMPRESSION: No mammographic evidence of malignancy. A result letter of this
screening mammogram will be mailed directly to the patient.

RECOMMENDATION:
Screening mammogram in one year. (Code:[id number]-8U6)

BI-RADS CATEGORY  1: Negative.

## 2024-05-08 DIAGNOSIS — R0902 Hypoxemia: Secondary | ICD-10-CM | POA: Diagnosis not present

## 2024-05-08 DIAGNOSIS — J9811 Atelectasis: Secondary | ICD-10-CM | POA: Diagnosis not present

## 2024-05-08 DIAGNOSIS — Z8619 Personal history of other infectious and parasitic diseases: Secondary | ICD-10-CM | POA: Diagnosis not present

## 2024-05-08 DIAGNOSIS — L98499 Non-pressure chronic ulcer of skin of other sites with unspecified severity: Secondary | ICD-10-CM | POA: Diagnosis not present

## 2024-05-08 DIAGNOSIS — J441 Chronic obstructive pulmonary disease with (acute) exacerbation: Secondary | ICD-10-CM | POA: Diagnosis not present

## 2024-05-08 DIAGNOSIS — F1910 Other psychoactive substance abuse, uncomplicated: Secondary | ICD-10-CM | POA: Diagnosis not present

## 2024-05-08 DIAGNOSIS — R000 Tachycardia, unspecified: Secondary | ICD-10-CM | POA: Diagnosis not present

## 2024-05-09 DIAGNOSIS — J4489 Other specified chronic obstructive pulmonary disease: Secondary | ICD-10-CM | POA: Diagnosis not present

## 2024-05-10 DIAGNOSIS — T148XXA Other injury of unspecified body region, initial encounter: Secondary | ICD-10-CM | POA: Diagnosis not present

## 2024-05-10 DIAGNOSIS — E870 Hyperosmolality and hypernatremia: Secondary | ICD-10-CM | POA: Diagnosis not present

## 2024-05-10 DIAGNOSIS — J122 Parainfluenza virus pneumonia: Secondary | ICD-10-CM | POA: Diagnosis not present

## 2024-05-10 DIAGNOSIS — F1910 Other psychoactive substance abuse, uncomplicated: Secondary | ICD-10-CM | POA: Diagnosis not present

## 2024-06-03 ENCOUNTER — Telehealth: Payer: Self-pay | Admitting: *Deleted

## 2024-06-03 ENCOUNTER — Other Ambulatory Visit: Payer: Self-pay

## 2024-06-14 DIAGNOSIS — K429 Umbilical hernia without obstruction or gangrene: Secondary | ICD-10-CM | POA: Diagnosis not present

## 2024-06-23 DIAGNOSIS — Z23 Encounter for immunization: Secondary | ICD-10-CM | POA: Diagnosis not present

## 2024-06-23 DIAGNOSIS — J441 Chronic obstructive pulmonary disease with (acute) exacerbation: Secondary | ICD-10-CM | POA: Diagnosis not present

## 2024-06-23 DIAGNOSIS — J4531 Mild persistent asthma with (acute) exacerbation: Secondary | ICD-10-CM | POA: Diagnosis not present

## 2024-06-23 DIAGNOSIS — K7460 Unspecified cirrhosis of liver: Secondary | ICD-10-CM | POA: Diagnosis not present

## 2024-06-23 DIAGNOSIS — Z1389 Encounter for screening for other disorder: Secondary | ICD-10-CM | POA: Diagnosis not present

## 2024-06-23 DIAGNOSIS — I10 Essential (primary) hypertension: Secondary | ICD-10-CM | POA: Diagnosis not present

## 2024-06-23 DIAGNOSIS — B182 Chronic viral hepatitis C: Secondary | ICD-10-CM | POA: Diagnosis not present

## 2024-06-23 DIAGNOSIS — Z0130 Encounter for examination of blood pressure without abnormal findings: Secondary | ICD-10-CM | POA: Diagnosis not present

## 2024-06-23 DIAGNOSIS — T148XXA Other injury of unspecified body region, initial encounter: Secondary | ICD-10-CM | POA: Diagnosis not present

## 2024-06-23 DIAGNOSIS — F1410 Cocaine abuse, uncomplicated: Secondary | ICD-10-CM | POA: Diagnosis not present

## 2024-07-27 DIAGNOSIS — M25511 Pain in right shoulder: Secondary | ICD-10-CM | POA: Diagnosis not present

## 2024-07-27 DIAGNOSIS — J449 Chronic obstructive pulmonary disease, unspecified: Secondary | ICD-10-CM | POA: Diagnosis not present

## 2024-08-02 DIAGNOSIS — F1490 Cocaine use, unspecified, uncomplicated: Secondary | ICD-10-CM | POA: Diagnosis not present

## 2024-08-02 DIAGNOSIS — Z79899 Other long term (current) drug therapy: Secondary | ICD-10-CM | POA: Diagnosis not present

## 2024-08-02 DIAGNOSIS — Z7951 Long term (current) use of inhaled steroids: Secondary | ICD-10-CM | POA: Diagnosis not present

## 2024-08-02 DIAGNOSIS — F17210 Nicotine dependence, cigarettes, uncomplicated: Secondary | ICD-10-CM | POA: Diagnosis not present

## 2024-08-02 DIAGNOSIS — K429 Umbilical hernia without obstruction or gangrene: Secondary | ICD-10-CM | POA: Diagnosis not present

## 2024-08-02 DIAGNOSIS — J449 Chronic obstructive pulmonary disease, unspecified: Secondary | ICD-10-CM | POA: Diagnosis not present

## 2024-08-02 DIAGNOSIS — Z8619 Personal history of other infectious and parasitic diseases: Secondary | ICD-10-CM | POA: Diagnosis not present

## 2024-08-02 DIAGNOSIS — Z23 Encounter for immunization: Secondary | ICD-10-CM | POA: Diagnosis not present

## 2024-08-05 DIAGNOSIS — M25811 Other specified joint disorders, right shoulder: Secondary | ICD-10-CM | POA: Diagnosis not present

## 2024-08-05 DIAGNOSIS — M62511 Muscle wasting and atrophy, not elsewhere classified, right shoulder: Secondary | ICD-10-CM | POA: Diagnosis not present

## 2024-08-05 DIAGNOSIS — M67813 Other specified disorders of tendon, right shoulder: Secondary | ICD-10-CM | POA: Diagnosis not present

## 2024-08-05 DIAGNOSIS — M25411 Effusion, right shoulder: Secondary | ICD-10-CM | POA: Diagnosis not present

## 2024-08-05 DIAGNOSIS — M75121 Complete rotator cuff tear or rupture of right shoulder, not specified as traumatic: Secondary | ICD-10-CM | POA: Diagnosis not present

## 2024-08-05 DIAGNOSIS — M19011 Primary osteoarthritis, right shoulder: Secondary | ICD-10-CM | POA: Diagnosis not present

## 2024-08-05 DIAGNOSIS — M25511 Pain in right shoulder: Secondary | ICD-10-CM | POA: Diagnosis not present

## 2024-08-10 DIAGNOSIS — M75101 Unspecified rotator cuff tear or rupture of right shoulder, not specified as traumatic: Secondary | ICD-10-CM | POA: Diagnosis not present

## 2024-08-27 DIAGNOSIS — M75101 Unspecified rotator cuff tear or rupture of right shoulder, not specified as traumatic: Secondary | ICD-10-CM | POA: Diagnosis not present

## 2024-09-30 DIAGNOSIS — T148XXA Other injury of unspecified body region, initial encounter: Secondary | ICD-10-CM | POA: Diagnosis not present

## 2024-09-30 DIAGNOSIS — Z1389 Encounter for screening for other disorder: Secondary | ICD-10-CM | POA: Diagnosis not present

## 2024-09-30 DIAGNOSIS — I2510 Atherosclerotic heart disease of native coronary artery without angina pectoris: Secondary | ICD-10-CM | POA: Diagnosis not present

## 2024-09-30 DIAGNOSIS — Z0130 Encounter for examination of blood pressure without abnormal findings: Secondary | ICD-10-CM | POA: Diagnosis not present

## 2024-09-30 DIAGNOSIS — F1410 Cocaine abuse, uncomplicated: Secondary | ICD-10-CM | POA: Diagnosis not present

## 2024-09-30 DIAGNOSIS — Z0131 Encounter for examination of blood pressure with abnormal findings: Secondary | ICD-10-CM | POA: Diagnosis not present

## 2024-09-30 DIAGNOSIS — Z23 Encounter for immunization: Secondary | ICD-10-CM | POA: Diagnosis not present

## 2024-09-30 DIAGNOSIS — B182 Chronic viral hepatitis C: Secondary | ICD-10-CM | POA: Diagnosis not present

## 2024-09-30 DIAGNOSIS — I10 Essential (primary) hypertension: Secondary | ICD-10-CM | POA: Diagnosis not present

## 2024-09-30 DIAGNOSIS — Z0000 Encounter for general adult medical examination without abnormal findings: Secondary | ICD-10-CM | POA: Diagnosis not present

## 2024-10-01 ENCOUNTER — Other Ambulatory Visit: Payer: Self-pay | Admitting: Nurse Practitioner

## 2024-10-01 DIAGNOSIS — Z1231 Encounter for screening mammogram for malignant neoplasm of breast: Secondary | ICD-10-CM

## 2024-10-14 DIAGNOSIS — Z0130 Encounter for examination of blood pressure without abnormal findings: Secondary | ICD-10-CM | POA: Diagnosis not present

## 2024-10-14 DIAGNOSIS — I2510 Atherosclerotic heart disease of native coronary artery without angina pectoris: Secondary | ICD-10-CM | POA: Diagnosis not present

## 2024-10-14 DIAGNOSIS — B182 Chronic viral hepatitis C: Secondary | ICD-10-CM | POA: Diagnosis not present

## 2024-10-14 DIAGNOSIS — Z0131 Encounter for examination of blood pressure with abnormal findings: Secondary | ICD-10-CM | POA: Diagnosis not present

## 2024-10-14 DIAGNOSIS — Z1389 Encounter for screening for other disorder: Secondary | ICD-10-CM | POA: Diagnosis not present

## 2024-10-14 DIAGNOSIS — Z0000 Encounter for general adult medical examination without abnormal findings: Secondary | ICD-10-CM | POA: Diagnosis not present

## 2024-10-14 DIAGNOSIS — T148XXD Other injury of unspecified body region, subsequent encounter: Secondary | ICD-10-CM | POA: Diagnosis not present

## 2024-11-02 ENCOUNTER — Encounter: Payer: Self-pay | Attending: Physician Assistant | Admitting: Physician Assistant

## 2024-11-02 DIAGNOSIS — S51811A Laceration without foreign body of right forearm, initial encounter: Secondary | ICD-10-CM | POA: Diagnosis not present

## 2024-11-02 DIAGNOSIS — S51812A Laceration without foreign body of left forearm, initial encounter: Secondary | ICD-10-CM | POA: Diagnosis not present

## 2024-11-02 DIAGNOSIS — K7469 Other cirrhosis of liver: Secondary | ICD-10-CM | POA: Diagnosis not present

## 2024-11-02 DIAGNOSIS — B182 Chronic viral hepatitis C: Secondary | ICD-10-CM | POA: Diagnosis not present

## 2024-11-02 DIAGNOSIS — F14188 Cocaine abuse with other cocaine-induced disorder: Secondary | ICD-10-CM | POA: Diagnosis not present

## 2024-11-04 DIAGNOSIS — I2510 Atherosclerotic heart disease of native coronary artery without angina pectoris: Secondary | ICD-10-CM | POA: Diagnosis not present

## 2024-11-04 DIAGNOSIS — E782 Mixed hyperlipidemia: Secondary | ICD-10-CM | POA: Diagnosis not present

## 2024-11-04 DIAGNOSIS — I10 Essential (primary) hypertension: Secondary | ICD-10-CM | POA: Diagnosis not present

## 2024-11-09 ENCOUNTER — Encounter: Admitting: Physician Assistant

## 2024-11-18 ENCOUNTER — Encounter: Admitting: Physician Assistant

## 2024-11-18 DIAGNOSIS — S51811A Laceration without foreign body of right forearm, initial encounter: Secondary | ICD-10-CM | POA: Insufficient documentation

## 2024-11-18 DIAGNOSIS — K7469 Other cirrhosis of liver: Secondary | ICD-10-CM | POA: Insufficient documentation

## 2024-11-18 DIAGNOSIS — B182 Chronic viral hepatitis C: Secondary | ICD-10-CM | POA: Insufficient documentation

## 2024-11-18 DIAGNOSIS — I10 Essential (primary) hypertension: Secondary | ICD-10-CM | POA: Diagnosis not present

## 2024-11-18 DIAGNOSIS — F14188 Cocaine abuse with other cocaine-induced disorder: Secondary | ICD-10-CM | POA: Diagnosis not present

## 2024-11-18 DIAGNOSIS — S51812A Laceration without foreign body of left forearm, initial encounter: Secondary | ICD-10-CM | POA: Diagnosis not present

## 2024-11-19 DIAGNOSIS — E278 Other specified disorders of adrenal gland: Secondary | ICD-10-CM | POA: Diagnosis not present

## 2024-11-19 DIAGNOSIS — N132 Hydronephrosis with renal and ureteral calculous obstruction: Secondary | ICD-10-CM | POA: Diagnosis not present

## 2024-11-25 ENCOUNTER — Encounter: Admitting: Internal Medicine

## 2024-11-25 DIAGNOSIS — S51812A Laceration without foreign body of left forearm, initial encounter: Secondary | ICD-10-CM | POA: Diagnosis not present

## 2024-11-25 DIAGNOSIS — S51811A Laceration without foreign body of right forearm, initial encounter: Secondary | ICD-10-CM | POA: Diagnosis not present

## 2024-12-02 ENCOUNTER — Encounter: Admitting: Internal Medicine

## 2024-12-02 ENCOUNTER — Other Ambulatory Visit: Payer: Self-pay | Admitting: Internal Medicine

## 2024-12-02 DIAGNOSIS — I10 Essential (primary) hypertension: Secondary | ICD-10-CM

## 2024-12-02 DIAGNOSIS — E782 Mixed hyperlipidemia: Secondary | ICD-10-CM

## 2024-12-06 ENCOUNTER — Ambulatory Visit
Admission: RE | Admit: 2024-12-06 | Discharge: 2024-12-06 | Disposition: A | Discharge status: Home / Self Care | Payer: Self-pay | Source: Ambulatory Visit | Attending: Internal Medicine | Admitting: Internal Medicine

## 2024-12-06 DIAGNOSIS — E782 Mixed hyperlipidemia: Secondary | ICD-10-CM | POA: Insufficient documentation

## 2024-12-06 DIAGNOSIS — I10 Essential (primary) hypertension: Secondary | ICD-10-CM | POA: Insufficient documentation

## 2024-12-13 ENCOUNTER — Encounter: Admitting: Physician Assistant

## 2024-12-13 DIAGNOSIS — S51812A Laceration without foreign body of left forearm, initial encounter: Secondary | ICD-10-CM | POA: Diagnosis not present

## 2024-12-17 ENCOUNTER — Ambulatory Visit
Admission: RE | Admit: 2024-12-17 | Discharge: 2024-12-17 | Disposition: A | Discharge status: Home / Self Care | Source: Ambulatory Visit | Attending: Nurse Practitioner | Admitting: Nurse Practitioner

## 2024-12-17 DIAGNOSIS — Z1231 Encounter for screening mammogram for malignant neoplasm of breast: Secondary | ICD-10-CM | POA: Diagnosis present

## 2024-12-23 ENCOUNTER — Encounter: Attending: Physician Assistant | Admitting: Physician Assistant

## 2024-12-23 DIAGNOSIS — S51812A Laceration without foreign body of left forearm, initial encounter: Secondary | ICD-10-CM | POA: Diagnosis present

## 2024-12-23 DIAGNOSIS — K7469 Other cirrhosis of liver: Secondary | ICD-10-CM | POA: Diagnosis not present

## 2024-12-23 DIAGNOSIS — B182 Chronic viral hepatitis C: Secondary | ICD-10-CM | POA: Insufficient documentation

## 2024-12-23 DIAGNOSIS — I10 Essential (primary) hypertension: Secondary | ICD-10-CM | POA: Insufficient documentation

## 2024-12-24 ENCOUNTER — Other Ambulatory Visit: Payer: Self-pay | Admitting: Family Medicine

## 2024-12-24 DIAGNOSIS — R928 Other abnormal and inconclusive findings on diagnostic imaging of breast: Secondary | ICD-10-CM

## 2024-12-29 ENCOUNTER — Ambulatory Visit
Admission: RE | Admit: 2024-12-29 | Discharge: 2024-12-29 | Disposition: A | Discharge status: Home / Self Care | Source: Ambulatory Visit | Attending: Family Medicine | Admitting: Family Medicine

## 2024-12-29 ENCOUNTER — Inpatient Hospital Stay: Admission: RE | Admit: 2024-12-29 | Discharge: 2024-12-29 | Attending: Family Medicine | Admitting: Family Medicine

## 2024-12-29 DIAGNOSIS — R928 Other abnormal and inconclusive findings on diagnostic imaging of breast: Secondary | ICD-10-CM | POA: Diagnosis present

## 2024-12-30 ENCOUNTER — Encounter: Admitting: Physician Assistant

## 2024-12-30 DIAGNOSIS — S51812A Laceration without foreign body of left forearm, initial encounter: Secondary | ICD-10-CM | POA: Diagnosis not present

## 2025-01-06 ENCOUNTER — Encounter: Admitting: Physician Assistant

## 2025-01-06 DIAGNOSIS — S51812A Laceration without foreign body of left forearm, initial encounter: Secondary | ICD-10-CM | POA: Diagnosis not present

## 2025-01-13 ENCOUNTER — Encounter: Admitting: Physician Assistant

## 2025-01-13 DIAGNOSIS — S51812A Laceration without foreign body of left forearm, initial encounter: Secondary | ICD-10-CM | POA: Diagnosis not present

## 2025-01-20 ENCOUNTER — Encounter: Admitting: Physician Assistant

## 2025-01-27 ENCOUNTER — Encounter: Admitting: Physician Assistant
